# Patient Record
Sex: Female | Born: 1980 | Race: Black or African American | Hispanic: No | Marital: Single | Smoking: Never smoker
Health system: Southern US, Community
[De-identification: ages and names within clinical notes are randomized; demographics above are authoritative.]

## PROBLEM LIST (undated history)

## (undated) HISTORY — PX: ABLATION: SHX5711

---

## 2004-02-23 ENCOUNTER — Ambulatory Visit: Payer: Self-pay | Admitting: Cardiology

## 2004-02-23 ENCOUNTER — Ambulatory Visit (HOSPITAL_COMMUNITY): Admission: RE | Admit: 2004-02-23 | Discharge: 2004-02-23 | Payer: Self-pay | Admitting: Internal Medicine

## 2004-04-01 ENCOUNTER — Encounter: Admission: RE | Admit: 2004-04-01 | Discharge: 2004-04-01 | Payer: Self-pay | Admitting: *Deleted

## 2004-04-07 ENCOUNTER — Ambulatory Visit (HOSPITAL_COMMUNITY): Admission: RE | Admit: 2004-04-07 | Discharge: 2004-04-08 | Payer: Self-pay | Admitting: *Deleted

## 2006-04-22 ENCOUNTER — Emergency Department: Payer: Self-pay | Admitting: Emergency Medicine

## 2007-02-28 ENCOUNTER — Emergency Department (HOSPITAL_COMMUNITY): Admission: EM | Admit: 2007-02-28 | Discharge: 2007-02-28 | Payer: Self-pay | Admitting: Emergency Medicine

## 2007-08-08 ENCOUNTER — Emergency Department (HOSPITAL_COMMUNITY): Admission: EM | Admit: 2007-08-08 | Discharge: 2007-08-08 | Payer: Self-pay | Admitting: Emergency Medicine

## 2007-10-25 ENCOUNTER — Ambulatory Visit (HOSPITAL_COMMUNITY): Payer: Self-pay | Admitting: Oncology

## 2007-10-25 ENCOUNTER — Encounter (HOSPITAL_COMMUNITY): Admission: RE | Admit: 2007-10-25 | Discharge: 2007-11-24 | Payer: Self-pay | Admitting: Oncology

## 2007-11-07 ENCOUNTER — Encounter: Admission: RE | Admit: 2007-11-07 | Discharge: 2007-11-07 | Payer: Self-pay | Admitting: Oncology

## 2007-12-02 ENCOUNTER — Encounter: Admission: RE | Admit: 2007-12-02 | Discharge: 2007-12-02 | Payer: Self-pay | Admitting: Oncology

## 2008-07-22 ENCOUNTER — Encounter (HOSPITAL_COMMUNITY): Admission: RE | Admit: 2008-07-22 | Discharge: 2008-08-21 | Payer: Self-pay | Admitting: Oncology

## 2008-07-22 ENCOUNTER — Ambulatory Visit (HOSPITAL_COMMUNITY): Payer: Self-pay | Admitting: Oncology

## 2008-12-11 ENCOUNTER — Emergency Department (HOSPITAL_COMMUNITY): Admission: EM | Admit: 2008-12-11 | Discharge: 2008-12-11 | Payer: Self-pay | Admitting: Emergency Medicine

## 2009-03-01 ENCOUNTER — Encounter: Admission: RE | Admit: 2009-03-01 | Discharge: 2009-03-12 | Payer: Self-pay | Admitting: Orthopedic Surgery

## 2009-03-16 ENCOUNTER — Encounter: Admission: RE | Admit: 2009-03-16 | Discharge: 2009-03-29 | Payer: Self-pay | Admitting: Orthopedic Surgery

## 2009-07-26 ENCOUNTER — Encounter (HOSPITAL_COMMUNITY): Admission: RE | Admit: 2009-07-26 | Discharge: 2009-08-25 | Payer: Self-pay | Admitting: Oncology

## 2009-07-26 ENCOUNTER — Ambulatory Visit (HOSPITAL_COMMUNITY): Payer: Self-pay | Admitting: Oncology

## 2010-01-16 ENCOUNTER — Emergency Department (HOSPITAL_COMMUNITY): Admission: EM | Admit: 2010-01-16 | Discharge: 2010-01-16 | Payer: Self-pay | Admitting: Emergency Medicine

## 2010-04-17 ENCOUNTER — Emergency Department (HOSPITAL_COMMUNITY)
Admission: EM | Admit: 2010-04-17 | Discharge: 2010-04-17 | Disposition: A | Discharge status: Home / Self Care | Payer: Medicaid Other | Attending: Emergency Medicine | Admitting: Emergency Medicine

## 2010-04-17 DIAGNOSIS — R112 Nausea with vomiting, unspecified: Secondary | ICD-10-CM | POA: Insufficient documentation

## 2010-04-17 DIAGNOSIS — M549 Dorsalgia, unspecified: Secondary | ICD-10-CM | POA: Insufficient documentation

## 2010-04-17 DIAGNOSIS — G8929 Other chronic pain: Secondary | ICD-10-CM | POA: Insufficient documentation

## 2010-04-17 LAB — DIFFERENTIAL
Eosinophils Absolute: 0.1 10*3/uL (ref 0.0–0.7)
Eosinophils Relative: 2 % (ref 0–5)
Lymphocytes Relative: 35 % (ref 12–46)
Lymphs Abs: 2.2 10*3/uL (ref 0.7–4.0)
Monocytes Relative: 9 % (ref 3–12)

## 2010-04-17 LAB — COMPREHENSIVE METABOLIC PANEL
Alkaline Phosphatase: 55 U/L (ref 39–117)
BUN: 9 mg/dL (ref 6–23)
CO2: 26 mEq/L (ref 19–32)
Calcium: 8.8 mg/dL (ref 8.4–10.5)
GFR calc non Af Amer: >60 mL/min (ref >60)
Glucose, Bld: 87 mg/dL (ref 70–99)
Potassium: 4.2 mEq/L (ref 3.5–5.1)
Total Protein: 7 g/dL (ref 6.0–8.3)

## 2010-04-17 LAB — URINALYSIS, ROUTINE W REFLEX MICROSCOPIC
Bilirubin Urine: NEGATIVE
Ketones, ur: NEGATIVE mg/dL
Leukocytes, UA: NEGATIVE
Nitrite: NEGATIVE
Urobilinogen, UA: 0.2 mg/dL (ref 0.0–1.0)
pH: 6.5 (ref 5.0–8.0)

## 2010-04-17 LAB — CBC
HCT: 39.7 % (ref 36.0–46.0)
MCH: 31 pg (ref 26.0–34.0)
MCV: 88 fL (ref 78.0–100.0)
Platelets: 348 10*3/uL (ref 150–400)
RDW: 12.2 % (ref 11.5–15.5)

## 2010-04-17 LAB — LIPASE, BLOOD: Lipase: 16 U/L (ref 11–59)

## 2010-05-30 LAB — DIFFERENTIAL
Basophils Absolute: 0 10*3/uL (ref 0.0–0.1)
Basophils Relative: 1 % (ref 0–1)
Eosinophils Absolute: 0.1 10*3/uL (ref 0.0–0.7)
Eosinophils Relative: 1 % (ref 0–5)
Neutrophils Relative %: 54 % (ref 43–77)

## 2010-05-30 LAB — CBC
HCT: 41 % (ref 36.0–46.0)
MCHC: 35.5 g/dL (ref 30.0–36.0)
MCV: 86.9 fL (ref 78.0–100.0)
Platelets: 407 10*3/uL — ABNORMAL HIGH (ref 150–400)
RDW: 12.6 % (ref 11.5–15.5)
WBC: 6.9 10*3/uL (ref 4.0–10.5)

## 2010-05-30 LAB — FERRITIN: Ferritin: 13 ng/mL (ref 10–291)

## 2010-06-16 LAB — CBC
HCT: 42.2 % (ref 36.0–46.0)
Hemoglobin: 14.6 g/dL (ref 12.0–15.0)
MCV: 89.1 fL (ref 78.0–100.0)
Platelets: 386 10*3/uL (ref 150–400)
RDW: 12 % (ref 11.5–15.5)

## 2010-06-16 LAB — DIFFERENTIAL
Basophils Absolute: 0 10*3/uL (ref 0.0–0.1)
Eosinophils Absolute: 0.1 10*3/uL (ref 0.0–0.7)
Eosinophils Relative: 1 % (ref 0–5)
Lymphocytes Relative: 38 % (ref 12–46)
Lymphs Abs: 2.6 10*3/uL (ref 0.7–4.0)
Monocytes Absolute: 0.3 10*3/uL (ref 0.1–1.0)

## 2010-06-16 LAB — HEMOCCULT GUIAC POC 1CARD (OFFICE): Fecal Occult Bld: NEGATIVE

## 2010-06-16 LAB — POCT I-STAT, CHEM 8
BUN: 8 mg/dL (ref 6–23)
Calcium, Ion: 1.14 mmol/L (ref 1.12–1.32)
Chloride: 103 mEq/L (ref 96–112)
Glucose, Bld: 86 mg/dL (ref 70–99)
TCO2: 25 mmol/L (ref 0–100)

## 2010-06-16 LAB — URINALYSIS, ROUTINE W REFLEX MICROSCOPIC
Bilirubin Urine: NEGATIVE
Ketones, ur: NEGATIVE mg/dL
Nitrite: NEGATIVE
Protein, ur: NEGATIVE mg/dL
pH: 7 (ref 5.0–8.0)

## 2010-06-21 LAB — DIFFERENTIAL
Basophils Absolute: 0 10*3/uL (ref 0.0–0.1)
Lymphocytes Relative: 37 % (ref 12–46)
Lymphs Abs: 2.1 10*3/uL (ref 0.7–4.0)
Monocytes Absolute: 0.3 10*3/uL (ref 0.1–1.0)
Monocytes Relative: 5 % (ref 3–12)
Neutro Abs: 3.2 10*3/uL (ref 1.7–7.7)

## 2010-06-21 LAB — CBC
HCT: 39.5 % (ref 36.0–46.0)
MCV: 88.1 fL (ref 78.0–100.0)
Platelets: 389 10*3/uL (ref 150–400)
RDW: 12.5 % (ref 11.5–15.5)

## 2010-06-21 LAB — COMPREHENSIVE METABOLIC PANEL
Albumin: 3.6 g/dL (ref 3.5–5.2)
BUN: 11 mg/dL (ref 6–23)
Calcium: 9.2 mg/dL (ref 8.4–10.5)
Creatinine, Ser: 0.66 mg/dL (ref 0.4–1.2)
GFR calc Af Amer: >60 mL/min (ref >60)
Total Protein: 7.5 g/dL (ref 6.0–8.3)

## 2010-06-21 LAB — IRON AND TIBC
Iron: 75 ug/dL (ref 42–135)
TIBC: 310 ug/dL (ref 250–470)
UIBC: 235 ug/dL

## 2010-06-21 LAB — FERRITIN: Ferritin: 26 ng/mL (ref 10–291)

## 2010-07-29 NOTE — Discharge Summary (Signed)
[name], [name]               ACCOUNT NO.:  [id number]   MEDICAL RECORD NO.:  [id number]          PATIENT TYPE:  OIB   LOCATION:  6532                         FACILITY:  [facility]   PHYSICIAN:  Mark E. Severiano Gilbert, M.D.    DATE OF BIRTH:  1980-08-24   DATE OF ADMISSION:  04/07/2004  DATE OF DISCHARGE:  04/08/2004                                 DISCHARGE SUMMARY   HISTORY:  Ms. Bronaugh came in the hospital as an outpatient for  electrophysiology study and possible radiofrequency ablation by Dr. Severiano Gilbert.   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  This was  successful.  The morning of April 08, 2004 she was seen by Dr. Severiano Gilbert.  Her  blood pressure was 115/65, heart rate 75, respirations 12 to 16.  He  recommended she go home on aspirin 325 mg daily X30 days.  She will follow  up with Dr. Severiano Gilbert on May 10, 2004 at 11:30.  She should do no strenuous  activity, no pushing, pulling, lifting x4 days.   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      BB/MEDQ  D:  04/08/2004  T:  04/08/2004  Job:  [id number]   cc:   Tesfaye D. Felecia Shelling, MD  [address]  [place]  [place] [address]  Fax: [phone]

## 2010-07-29 NOTE — Cardiovascular Report (Signed)
[name], [name]               ACCOUNT NO.:  [id number]   MEDICAL RECORD NO.:  [id number]          PATIENT TYPE:  OIB   LOCATION:  6532                         FACILITY:  [facility]   PHYSICIAN:  Mark E. Severiano Gilbert, M.D.    DATE OF BIRTH:  08-30-1980   DATE OF PROCEDURE:  04/07/2004  DATE OF DISCHARGE:                              CARDIAC CATHETERIZATION   PROCEDURES PERFORMED:  1.  Comprehensive electrophysiology study with arrhythmia induction to      include left atrial pacing from coronary sinus and left atrial coronary      sinus and right atrial mapping.  2.  Radiofrequency ablation of left lateral accessory pathway.  3.  Transseptal puncture for access to left side for ablation.  4.  Isoproterenol infusion with arrhythmia induction.   PREPROCEDURE DIAGNOSIS:  Paroxysmal supraventricular tachycardia.   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   OPERATOR:  Mark E. Severiano Gilbert, M.D.   COMPLICATIONS:  None other than mild nausea.   ESTIMATED BLOOD LOSS:  Less than 30 mL.   PROCEDURE IN DETAIL:  The patient was brought to the EP laboratory in a  fasted state.  The patient was prepped and draped over bilateral groins in  usual manner.  Anesthesia with 1% local lidocaine.  Conscious sedation with  intermittent injections of midazolam and fentanyl.  Two 7 French sheaths  were placed in the left femoral vein.  An 8 French SL2 sheath was placed in  the right femoral vein.  A 7 French short sheath was placed in the right  femoral vein.  A 7 French short sheath was placed in the right femoral  artery.  Via the catheters, a quadripolar catheter was placed in the high  right atrium.  A hexapolar catheter was placed in the His' bundle area.  A  quadripolar catheter was placed in the right ventricular apex and a  decapolar catheter was placed in the coronary sinus  with difficulty.  Coronary sinus positioning was tenuous and we repeatedly lost position.  A  diagnostic electrophysiology study was performed which demonstrated the  following findings:  Baseline rhythm was sinus rhythm with a cycle of 678  msec.  Normal PR, QRS and QT intervals.  Measured AH was 76 msec.  HV was  39.  There was no evidence of preexcitation.  The retrograde VA Wenckebach  was 230 msec with eccentric nondecremental atrial activation.  The  retrograde effective refractory period of the [place] conduction was 250 msec off  of a drive train of 161 msec, again with nondecremental, nonmidline  conduction.  Antegrade affective refractory period testing of the AV node  was precluded secondary to abundant arrhythmia induction.  Arrhythmia was  induced easily with spontaneously introduced PACs or catheter-induced PACs  or electrically induced PACs.  There did not appear to be dual AV node  physiology.  There was no ventricular excitation.  During decremental AV  node conduction, there was retrograde activation of an accessory pathway  localized on the left side around  the left lateral mitral annulus.  This  resulted in a narrow complex tachycardia, cycle length approximately 290  msec.  The VA time was greater than 70 msec.  Preexcitation index suggested  a left-sided accessory pathway.  Secondary to difficulty maintaining the  coronary sinus position, a retrograde aortic approach was used to place an  EPT standard curve 4 mm tip 7 French ablation catheter into the left  ventricle and left atrium.  It was very difficult to position this catheter  on the annulus secondary to constant interaction with ventricular  trabeculations.  While this confirmed eccentric atrial activation during  tachycardia, as well as ventricular pacing, I was unable to position the  catheter in a position that would allow ablation despite multiple attempts.  Next, the coronary sinus was mapped via a right-sided  sheath and noted to  have very early [place] conduction along the left posterolateral margin.  Several  radiofrequency burns delivered in this area were unable to interrupt  retrograde accessory pathway conduction.  Finally, under continuous  fluoroscopic and hemodynamic guidance, a Diag SL2 catheter using a Brock  needle was used to puncture the fossa ovalis.  After confirming position in  the atrial both fluoroscopically, as well as hemodynamics, this sheath was  used to advance the EPT standard curve ablation catheter onto the left  lateral annulus of the mitral valve.  The mitral valve annulus was mapped  carefully and several burns were delivered without effect.  Finally, a very  early continuous signal was developed at approximately 5 o'clock in the LAO  projection on the left posterolateral annulus of the mitral valve.  There  was rapid loss of VA conduction after about 10 seconds of RF delivery, which  was continued for 45 seconds.  There were excellent heating characteristics.  Two short 20-second burns were done on either side of the initially  successful burn for insurance.  After waiting 20 minutes, post EP study was  performed.  The AV node ERP on isoproterenol was 550 and less than 230  secondary to atrial ERP.  Off of isoproterenol, the atrial ERP was noted to  be 220 msec off of a drive train of 478 msec.  The VA block cycle length was  noted to be 550 msec, which is dramatically changed from prior to ablation.  After awaiting 20 minutes, there was still persistent of VA conduction block  during right ventricular apical pacing consistent with successful ablation  of the left posterolateral accessory pathway.  The patient tolerated the  procedure well, except for some mild nausea with occasional vomitus.  She  was treated with Zofran and 12.5 mg of Phenergan with some abatement.  This  appears related to narcotic use for sedation.  IMPRESSION:  Paroxysmal supraventricular  tachycardia secondary to an  orthodromic atrioventricular re-entrant tachycardia using a left-sided  accessory pathway.  Successful left-sided accessory pathway ablation.      MEP/MEDQ  D:  04/07/2004  T:  04/07/2004  Job:  [id number]

## 2010-07-29 NOTE — Procedures (Signed)
[name], [name]               ACCOUNT NO.:  [id number]   MEDICAL RECORD NO.:  [id number]          PATIENT TYPE:  OUT   LOCATION:  RAD                           FACILITY:  [facility]   PHYSICIAN:  Belgrade Bing, M.D.  DATE OF BIRTH:  1980-03-24   DATE OF PROCEDURE:  02/23/2004  DATE OF DISCHARGE:                                  ECHOCARDIOGRAM   CLINICAL DATA:  A 30 year old woman with palpitations and dyspnea.   M-MODE:  1.  Aorta 2.3.  2.  Left atrium 3.3.  3.  Septum 1.0.  4.  Posterior wall 0.9.  5.  LV diastole 4.1.  6.  LV systole 3.0.   1.  Technically adequate echocardiographic study.  2.  Normal left atrium, right atrium, and right ventricle.  3.  Normal aorta, mitral, tricuspid, and pulmonic valves.  4.  Normal internal dimension, wall thickness, regional and global function      of the left ventricle.  5.  Normal IVC.  6.  Normal Doppler examination; physiologic tricuspid regurgitation;      estimate of RV systolic pressure is not precise but appears      approximately normal.     Robe   RR/MEDQ  D:  02/24/2004  T:  02/24/2004  Job:  [id number]

## 2010-12-09 LAB — COMPREHENSIVE METABOLIC PANEL
ALT: 14
Albumin: 3.8
Alkaline Phosphatase: 70
GFR calc Af Amer: >60
Potassium: 4.1
Sodium: 136
Total Protein: 6.9

## 2010-12-09 LAB — DIFFERENTIAL
Basophils Relative: 1
Monocytes Absolute: 0.3
Monocytes Relative: 5
Neutro Abs: 3.9

## 2010-12-09 LAB — CBC
Platelets: 487 — ABNORMAL HIGH
RDW: 13.2

## 2010-12-09 LAB — IRON AND TIBC: Saturation Ratios: 28

## 2011-03-14 ENCOUNTER — Encounter: Payer: Self-pay | Admitting: *Deleted

## 2011-03-14 ENCOUNTER — Emergency Department (HOSPITAL_COMMUNITY)
Admission: EM | Admit: 2011-03-14 | Discharge: 2011-03-14 | Disposition: A | Discharge status: Home / Self Care | Payer: Medicaid Other | Source: Home / Self Care

## 2011-03-14 DIAGNOSIS — J039 Acute tonsillitis, unspecified: Secondary | ICD-10-CM

## 2011-03-14 DIAGNOSIS — J069 Acute upper respiratory infection, unspecified: Secondary | ICD-10-CM

## 2011-03-14 MED ORDER — LORATADINE 10 MG PO TABS: 10.0000 mg | ORAL_TABLET | Freq: Every day | ORAL | Status: DC

## 2011-03-14 MED ORDER — AMOXICILLIN 875 MG PO TABS: 875.0000 mg | ORAL_TABLET | Freq: Two times a day (BID) | ORAL | Status: AC

## 2011-03-14 NOTE — ED Notes (Signed)
Pt  Reports        Symptoms  Of  sorethroat        Aching  All over         Congested  And  Cough    Symptoms  X   1   Week           Pt  Is  Sitting  On  Exam    Table  In no  Distress          pts  Appearance  Does  Not  Reflect  Stated  Pain  Scale

## 2011-03-14 NOTE — ED Provider Notes (Signed)
History     CSN: [id number]  Arrival date & time 03/14/11  1106   None     Chief Complaint  Patient presents with  . Sore Throat    (Consider location/radiation/quality/duration/timing/severity/associated sxs/prior treatment) HPI Comments: Pt presents with c/o sore throat - onset 2-3 days ago. Pain worsens with swallowing. She has had nasal congestion, sneezing, cough and chills for a couple of weeks. Nasal mucus is clear and cough is nonproductive. She denies taking any cough or cold medications for symptoms.   The history is provided by the patient.    History reviewed. No pertinent past medical history.  History reviewed. No pertinent past surgical history.  History reviewed. No pertinent family history.  History  Substance Use Topics  . Smoking status: Never Smoker   . Smokeless tobacco: Not on file  . Alcohol Use: No    OB History    Grav Para Term Preterm Abortions TAB SAB Ect Mult Living                  Review of Systems  Constitutional: Positive for chills. Negative for fever and fatigue.  HENT: Positive for congestion, sore throat, rhinorrhea and sneezing. Negative for ear pain, postnasal drip and sinus pressure.   Respiratory: Positive for cough. Negative for shortness of breath and wheezing.   Cardiovascular: Negative for chest pain and palpitations.    Allergies  Review of patient's allergies indicates no known allergies.  Home Medications   Current Outpatient Rx  Name Route Sig Dispense Refill  . IBUPROFEN 800 MG PO TABS Oral Take 800 mg by mouth every 8 (eight) hours as needed.      . AMOXICILLIN 875 MG PO TABS Oral Take 1 tablet (875 mg total) by mouth 2 (two) times daily. 20 tablet 0  . LORATADINE 10 MG PO TABS Oral Take 1 tablet (10 mg total) by mouth daily. 30 tablet 0    BP 129/106  Pulse 92  Temp(Src) 100 F (37.8 C) (Oral)  Resp 18  SpO2 100%  LMP 03/01/2011  Physical Exam  Nursing note and vitals reviewed. Constitutional: She  appears well-developed and well-nourished. No distress.  HENT:  Head: Normocephalic and atraumatic. No trismus in the jaw.  Right Ear: Tympanic membrane, external ear and ear canal normal.  Left Ear: Tympanic membrane, external ear and ear canal normal.  Nose: Nose normal.  Mouth/Throat: Uvula is midline and mucous membranes are normal. No oral lesions. No uvula swelling. Oropharyngeal exudate and posterior oropharyngeal erythema present. No posterior oropharyngeal edema or tonsillar abscesses.  Neck: Neck supple.  Cardiovascular: Normal rate, regular rhythm and normal heart sounds.   Pulmonary/Chest: Effort normal and breath sounds normal. No respiratory distress.  Lymphadenopathy:    She has no cervical adenopathy.  Neurological: She is alert.  Skin: Skin is warm and dry.  Psychiatric: She has a normal mood and affect.    ED Course  Procedures (including critical care time)  Labs Reviewed - No data to display No results found.   1. Tonsillitis   2. Acute URI       MDM  Bilat tonsilar erythema and mild exudate with URI symptoms.         Melody Comas, [place] 03/14/11 1409

## 2011-03-18 NOTE — ED Provider Notes (Signed)
Medical screening examination/treatment/procedure(s) were performed by non-physician practitioner and as supervising physician I was immediately available for consultation/collaboration.  Srishti Strnad M. MD   Kenlei Safi M Jameia Makris, MD 03/18/11 1824 

## 2012-07-21 ENCOUNTER — Emergency Department (HOSPITAL_COMMUNITY)
Admission: EM | Admit: 2012-07-21 | Discharge: 2012-07-21 | Disposition: A | Discharge status: Home / Self Care | Payer: Self-pay | Attending: Emergency Medicine | Admitting: Emergency Medicine

## 2012-07-21 ENCOUNTER — Encounter (HOSPITAL_COMMUNITY): Payer: Self-pay | Admitting: Emergency Medicine

## 2012-07-21 ENCOUNTER — Emergency Department (HOSPITAL_COMMUNITY): Payer: Self-pay

## 2012-07-21 DIAGNOSIS — Y9367 Activity, basketball: Secondary | ICD-10-CM | POA: Insufficient documentation

## 2012-07-21 DIAGNOSIS — S62639A Displaced fracture of distal phalanx of unspecified finger, initial encounter for closed fracture: Secondary | ICD-10-CM | POA: Insufficient documentation

## 2012-07-21 DIAGNOSIS — Y92838 Other recreation area as the place of occurrence of the external cause: Secondary | ICD-10-CM | POA: Insufficient documentation

## 2012-07-21 DIAGNOSIS — W219XXA Striking against or struck by unspecified sports equipment, initial encounter: Secondary | ICD-10-CM | POA: Insufficient documentation

## 2012-07-21 DIAGNOSIS — Y9239 Other specified sports and athletic area as the place of occurrence of the external cause: Secondary | ICD-10-CM | POA: Insufficient documentation

## 2012-07-21 DIAGNOSIS — Z880 Allergy status to penicillin: Secondary | ICD-10-CM | POA: Insufficient documentation

## 2012-07-21 DIAGNOSIS — Z87828 Personal history of other (healed) physical injury and trauma: Secondary | ICD-10-CM | POA: Insufficient documentation

## 2012-07-21 MED ORDER — NAPROXEN 500 MG PO TABS: 500.0000 mg | ORAL_TABLET | Freq: Two times a day (BID) | ORAL | Status: DC

## 2012-07-21 MED ORDER — HYDROCODONE-ACETAMINOPHEN 5-325 MG PO TABS: 1.0000 | ORAL_TABLET | ORAL | Status: DC | PRN

## 2012-07-21 NOTE — Progress Notes (Signed)
Orthopedic Tech Progress Note Patient Details:  [name] June 12, 1980 [id number]  Ortho Devices Type of Ortho Device: Finger splint   Haskell Flirt 07/21/2012, 11:22 PM

## 2012-07-21 NOTE — ED Provider Notes (Signed)
History    This chart was scribed for a non-physician practitioner, Dierdre Forth, PA-C, working with Loren Racer, MD by Frederik Pear, ED Scribe. This patient was seen in room TR05C/TR05C and the patient's care was started at 2144.   CSN: [id number]  Arrival date & time 07/21/12  2127   First MD Initiated Contact with Patient 07/21/12 2144      Chief Complaint  Patient presents with  . Hand Pain    (Consider location/radiation/quality/duration/timing/severity/associated sxs/prior treatment) Patient is a 32 y.o. female presenting with hand pain. The history is provided by the patient and medical records. No language interpreter was used.  Hand Pain This is a new problem. The current episode started more than 1 week ago. The problem occurs constantly. The problem has been gradually worsening. Pertinent negatives include no chest pain, no abdominal pain, no headaches and no shortness of breath. Exacerbated by: movement. Nothing relieves the symptoms. Treatments tried: static finger splint. The treatment provided no relief.    HPI Comments:  [name] is a 32 y.o. female brought in by parents to the Emergency Department complaining of sudden onset, gradually worsening, severe left index finger pain that is aggravated by moving it and alleviated by thing that began 1 month ago when she jammed it straight on while playing basketball. She reports that since the initial injury that although she has been wearing a static finger splint that she has repeatedly injured the finger over the last month. She denies fever, chills, or drainage from the fever. She denies treatment at home. She has no chronic medical conditions that she require daily medications.  History reviewed. No pertinent past medical history.  Past Surgical History  Procedure Laterality Date  . Ablation      History reviewed. No pertinent family history.  History  Substance Use Topics  . Smoking status: Never  Smoker   . Smokeless tobacco: Not on file  . Alcohol Use: No    OB History   Grav Para Term Preterm Abortions TAB SAB Ect Mult Living                  Review of Systems  Constitutional: Negative for fever, diaphoresis, appetite change, fatigue and unexpected weight change.  HENT: Negative for mouth sores and neck stiffness.   Eyes: Negative for visual disturbance.  Respiratory: Negative for cough, chest tightness, shortness of breath and wheezing.   Cardiovascular: Negative for chest pain.  Gastrointestinal: Negative for nausea, vomiting, abdominal pain, diarrhea and constipation.  Endocrine: Negative for polydipsia, polyphagia and polyuria.  Genitourinary: Negative for dysuria, urgency, frequency and hematuria.  Musculoskeletal: Positive for arthralgias (left index finger). Negative for back pain.  Skin: Negative for rash.  Allergic/Immunologic: Negative for immunocompromised state.  Neurological: Negative for syncope, light-headedness and headaches.  Hematological: Does not bruise/bleed easily.  Psychiatric/Behavioral: Negative for sleep disturbance. The patient is not nervous/anxious.     Allergies  Penicillins  Home Medications   Current Outpatient Rx  Name  Route  Sig  Dispense  Refill  . HYDROcodone-acetaminophen (NORCO/VICODIN) 5-325 MG per tablet   Oral   Take 1 tablet by mouth every 4 (four) hours as needed for pain.   6 tablet   0   . naproxen (NAPROSYN) 500 MG tablet   Oral   Take 1 tablet (500 mg total) by mouth 2 (two) times daily with a meal.   30 tablet   0     BP 137/87  Pulse 73  Temp(Src)  98.6 F (37 C) (Oral)  Resp 16  SpO2 98%  LMP 07/15/2012  Physical Exam  Nursing note and vitals reviewed. Constitutional: She is oriented to person, place, and time. She appears well-developed and well-nourished. No distress.  HENT:  Head: Normocephalic and atraumatic.  Mouth/Throat: Oropharynx is clear and moist. No oropharyngeal exudate.  Eyes:  Conjunctivae are normal. No scleral icterus.  Neck: Normal range of motion. Neck supple.  Cardiovascular: Normal rate, regular rhythm, normal heart sounds and intact distal pulses.   No murmur heard. Pulmonary/Chest: Effort normal and breath sounds normal. No respiratory distress. She has no wheezes.  Abdominal: Soft. Bowel sounds are normal. She exhibits no mass. There is no tenderness. There is no rebound and no guarding.  Musculoskeletal: Normal range of motion. She exhibits tenderness. She exhibits no edema.  Left index finger flexor tendon is intact. Extensor tendon is intact. Swelling noted to the distal portion of the left pointer finger with tenderness to palpation. Intact flexion with absent extension of the left pointer finger.    Neurological: She is alert and oriented to person, place, and time. A sensory deficit is present. She exhibits normal muscle tone. Coordination normal.  Speech is clear and goal oriented Moves extremities without ataxia Decrease sensation from DIP to finger tip.  Normal sensation from DIP to palm.  Skin: Skin is warm and dry. No rash noted. She is not diaphoretic.  Discoloration and pigmentation loss of the distal pointer and middle finger  Psychiatric: She has a normal mood and affect.    ED Course  Procedures (including critical care time)  DIAGNOSTIC STUDIES: Oxygen Saturation is 98% on room air, normal by my interpretation.    COORDINATION OF CARE:  22:70- Discussed planned course of treatment with the patient's mother, including following up with a hand surgeon, antiinflammatories, and pain medication, who is agreeable at this time.  Labs Reviewed - No data to display Dg Finger Index Left  07/21/2012  *RADIOLOGY REPORT*  Clinical Data: Hand pain  LEFT INDEX FINGER 2+V  Comparison: None.  Findings: Fracture of the dorsal base of the distal phalanx second digit with associated displacement and overlying soft tissue swelling.  IMPRESSION:  Avulsion type fracture dorsal base of the distal phalanx second digit.   Original Report Authenticated By: Jearld Lesch, M.D.      1. Avulsion fracture of distal phalanx of finger, closed, initial encounter       MDM  [name] presents with > 1 mo of finger pain.  Patient X-Ray with Avulsion type fracture dorsal base of the distal phalanx second digit. I personally reviewed the imaging tests through PACS system.  I reviewed available ER/hospitalization records through the EMR. Likely tendon rupture 2/2 hx, PE and x-ray.  Since injury is old, will have pt f/u with hand surgery on an outpatient basis.finger splinted while in ED, conservative therapy recommended and discussed. Patient will be dc home & is agreeable with above plan.   I personally performed the services described in this documentation, which was scribed in my presence. The recorded information has been reviewed and is accurate.    Dahlia Client Giavonni Fonder, PA-C 07/21/12 2334

## 2012-07-21 NOTE — ED Notes (Signed)
Patient states she rejammed her pointer finger on left hand, increased pain and swelling.

## 2012-07-21 NOTE — ED Notes (Signed)
Patient reports numbness in tip of  left index finger.  PMS intact, no deformity noted.

## 2012-07-23 NOTE — ED Provider Notes (Signed)
Medical screening examination/treatment/procedure(s) were performed by non-physician practitioner and as supervising physician I was immediately available for consultation/collaboration.   Loren Racer, MD 07/23/12 1544

## 2012-12-27 ENCOUNTER — Encounter (HOSPITAL_COMMUNITY): Payer: Self-pay | Admitting: Emergency Medicine

## 2012-12-27 ENCOUNTER — Emergency Department (HOSPITAL_COMMUNITY)
Admission: EM | Admit: 2012-12-27 | Discharge: 2012-12-27 | Disposition: A | Discharge status: Home / Self Care | Payer: Medicaid Other | Attending: Emergency Medicine | Admitting: Emergency Medicine

## 2012-12-27 DIAGNOSIS — Z79899 Other long term (current) drug therapy: Secondary | ICD-10-CM | POA: Insufficient documentation

## 2012-12-27 DIAGNOSIS — L509 Urticaria, unspecified: Secondary | ICD-10-CM | POA: Insufficient documentation

## 2012-12-27 DIAGNOSIS — Z880 Allergy status to penicillin: Secondary | ICD-10-CM | POA: Insufficient documentation

## 2012-12-27 DIAGNOSIS — Z791 Long term (current) use of non-steroidal anti-inflammatories (NSAID): Secondary | ICD-10-CM | POA: Insufficient documentation

## 2012-12-27 MED ORDER — PREDNISONE 20 MG PO TABS
20.0000 mg | ORAL_TABLET | Freq: Once | ORAL | Status: AC
  Administered 2012-12-27: 20 mg via ORAL
  Filled 2012-12-27: qty 1

## 2012-12-27 MED ORDER — PREDNISONE 10 MG PO TABS: 20.0000 mg | ORAL_TABLET | Freq: Two times a day (BID) | ORAL | Status: DC

## 2012-12-27 NOTE — ED Provider Notes (Signed)
CSN: [id number]     Arrival date & time 12/27/12  0047 History   First MD Initiated Contact with Patient 12/27/12 0118     Chief Complaint  Patient presents with  . Allergic Reaction   (Consider location/radiation/quality/duration/timing/severity/associated sxs/prior Treatment) HPI Comments: Patient presents with two-day history of rash to the front of her abdomen and anterior thighs. She denies any new contacts or exposures. She denies any pain. She states the rash is itchy and raised.  Patient is a 32 y.o. female presenting with allergic reaction. The history is provided by the patient.  Allergic Reaction Presenting symptoms: itching and rash   Severity:  Moderate Prior allergic episodes:  No prior episodes Context comment:  Unknown Relieved by:  Nothing Worsened by:  Nothing tried Ineffective treatments:  Antihistamines   History reviewed. No pertinent past medical history. Past Surgical History  Procedure Laterality Date  . Ablation     History reviewed. No pertinent family history. History  Substance Use Topics  . Smoking status: Never Smoker   . Smokeless tobacco: Not on file  . Alcohol Use: No   OB History   Grav Para Term Preterm Abortions TAB SAB Ect Mult Living                 Review of Systems  Skin: Positive for itching and rash.  All other systems reviewed and are negative.    Allergies  Penicillins  Home Medications   Current Outpatient Rx  Name  Route  Sig  Dispense  Refill  . ibuprofen (ADVIL,MOTRIN) 800 MG tablet   Oral   Take 800 mg by mouth every 8 (eight) hours as needed for pain.         Marland Kitchen omeprazole (PRILOSEC) 20 MG capsule   Oral   Take 20 mg by mouth daily.         Marland Kitchen HYDROcodone-acetaminophen (NORCO/VICODIN) 5-325 MG per tablet   Oral   Take 1 tablet by mouth every 4 (four) hours as needed for pain.   6 tablet   0   . naproxen (NAPROSYN) 500 MG tablet   Oral   Take 1 tablet (500 mg total) by mouth 2 (two) times daily with  a meal.   30 tablet   0    BP 163/100  Pulse 82  Temp(Src) 98.7 F (37.1 C) (Oral)  Resp 16  Ht 5' (1.524 m)  Wt 171 lb (77.565 kg)  BMI 33.4 kg/m2  SpO2 99% Physical Exam  Nursing note and vitals reviewed. Constitutional: She is oriented to person, place, and time. She appears well-developed and well-nourished. No distress.  HENT:  Head: Normocephalic and atraumatic.  Mouth/Throat: Oropharynx is clear and moist.  Neck: Normal range of motion. Neck supple.  Cardiovascular: Normal rate and regular rhythm.   No murmur heard. Pulmonary/Chest: Effort normal and breath sounds normal. No respiratory distress.  Neurological: She is alert and oriented to person, place, and time.  Skin: She is not diaphoretic.  There is a raised erythematous rash to the anterior thighs and anterior abdomen.    ED Course  Procedures (including critical care time) Labs Review Labs Reviewed - No data to display Imaging Review No results found.  EKG Interpretation   None       MDM  No diagnosis found. This appears to be an urticaria. We'll treat with steroids and antihistamines. Return as needed. There is no evidence for anaphylaxis as blood pressure is adequate and there is no airway involvement.  Geoffery Lyons, MD 12/27/12 [phone]

## 2012-12-27 NOTE — ED Notes (Signed)
Patient with rash/welch all over her abdomen and thighs.  States that she noticed a few days ago and it went away and came back today.  Patient took benadryl around 7pm today.

## 2019-04-05 ENCOUNTER — Other Ambulatory Visit: Payer: Self-pay

## 2019-04-05 ENCOUNTER — Ambulatory Visit
Admission: EM | Admit: 2019-04-05 | Discharge: 2019-04-05 | Disposition: A | Discharge status: Home / Self Care | Payer: No Typology Code available for payment source | Attending: Emergency Medicine | Admitting: Emergency Medicine

## 2019-04-05 DIAGNOSIS — Z20822 Contact with and (suspected) exposure to covid-19: Secondary | ICD-10-CM

## 2019-04-05 DIAGNOSIS — R05 Cough: Secondary | ICD-10-CM | POA: Diagnosis present

## 2019-04-05 DIAGNOSIS — J029 Acute pharyngitis, unspecified: Secondary | ICD-10-CM | POA: Diagnosis present

## 2019-04-05 DIAGNOSIS — R059 Cough, unspecified: Secondary | ICD-10-CM

## 2019-04-05 LAB — POCT RAPID STREP A (OFFICE): Rapid Strep A Screen: NEGATIVE

## 2019-04-05 NOTE — ED Triage Notes (Signed)
Pt developed sore throat and swollen tonsils yesterday, denies fever and h/o strep

## 2019-04-05 NOTE — ED Provider Notes (Signed)
[facility] CARE CENTER   [id number] 04/05/19 Arrival Time: 1052  NW:GNFA THROAT  SUBJECTIVE: History from: patient.  [name] is a 39 y.o. female who presents with abrupt onset of slight dry cough, sore throat and swollen tonsils x 1 day.  Denies sick exposure to COVID, flu or strep.  Denies recent travel.  Has NOT tried OTC medication.  Symptoms are made worse with swallowing, but tolerating liquids and own secretions without difficulty.  Reports previous symptoms in the past that improved without intervention.  Denies fever, chills, fatigue, ear pain, sinus pain, rhinorrhea, nasal congestion, cough, SOB, wheezing, chest pain, nausea, rash, changes in bowel or bladder habits.    ROS: As per HPI.  All other pertinent ROS negative.     History reviewed. No pertinent past medical history. Past Surgical History:  Procedure Laterality Date  . ABLATION     Allergies  Allergen Reactions  . Penicillins     Childhood reaction    No current facility-administered medications on file prior to encounter.   Current Outpatient Medications on File Prior to Encounter  Medication Sig Dispense Refill  . ibuprofen (ADVIL,MOTRIN) 800 MG tablet Take 800 mg by mouth every 8 (eight) hours as needed for pain.    . [DISCONTINUED] omeprazole (PRILOSEC) 20 MG capsule Take 20 mg by mouth daily.     Social History   Socioeconomic History  . Marital status: Single    Spouse name: Not on file  . Number of children: Not on file  . Years of education: Not on file  . Highest education level: Not on file  Occupational History  . Not on file  Tobacco Use  . Smoking status: Never Smoker  . Smokeless tobacco: Never Used  Substance and Sexual Activity  . Alcohol use: No  . Drug use: No  . Sexual activity: Not on file  Other Topics Concern  . Not on file  Social History Narrative  . Not on file   Social Determinants of Health   Financial Resource Strain:   . Difficulty of Paying Living  Expenses: Not on file  Food Insecurity:   . Worried About Programme researcher, broadcasting/film/video in the Last Year: Not on file  . Ran Out of Food in the Last Year: Not on file  Transportation Needs:   . Lack of Transportation (Medical): Not on file  . Lack of Transportation (Non-Medical): Not on file  Physical Activity:   . Days of Exercise per Week: Not on file  . Minutes of Exercise per Session: Not on file  Stress:   . Feeling of Stress : Not on file  Social Connections:   . Frequency of Communication with Friends and Family: Not on file  . Frequency of Social Gatherings with Friends and Family: Not on file  . Attends Religious Services: Not on file  . Active Member of Clubs or Organizations: Not on file  . Attends Banker Meetings: Not on file  . Marital Status: Not on file  Intimate Partner Violence:   . Fear of Current or Ex-Partner: Not on file  . Emotionally Abused: Not on file  . Physically Abused: Not on file  . Sexually Abused: Not on file   Family History  Problem Relation Age of Onset  . Healthy Mother   . Healthy Father     OBJECTIVE:  Vitals:   04/05/19 1106  BP: 114/75  Pulse: 86  Resp: 18  Temp: 99 F (37.2 C)  SpO2: 97%  General appearance: alert; well-appearing, nontoxic, speaking in full sentences and managing own secretions HEENT: NCAT; Ears: EACs clear, TMs pearly gray with visible cone of light, without erythema; Eyes: PERRL, EOMI grossly; Nose: patent without rhinorrhea, turbinates mildly swollen and erythematous; Throat: oropharynx clear, tonsils not enlarged or erythematous without white tonsillar exudates, uvula midline Neck: supple without LAD Lungs: CTA bilaterally without adventitious breath sounds; cough absent Heart: regular rate and rhythm.   Skin: warm and dry Psychological: alert and cooperative; normal mood and affect  LABS: Results for orders placed or performed during the hospital encounter of 04/05/19 (from the past 24 hour(s))   POCT rapid strep A     Status: None   Collection Time: 04/05/19 11:21 AM  Result Value Ref Range   Rapid Strep A Screen Negative Negative     ASSESSMENT & PLAN:  1. Suspected COVID-19 virus infection   2. Sore throat   3. Cough    Strep test negative, will send out for culture and we will call you with results Declines COVID test Cannot rule out COVID based on symptoms with negative strep test Per CDC: You should remain isolated in your home for 10 days from symptom onset AND greater than 72 hours after symptoms resolution (absence of fever without the use of fever-reducing medication and improvement in respiratory symptoms), whichever is longer Get plenty of rest and push fluids Drink warm or cool liquids, use throat lozenges, or popsicles to help alleviate symptoms You may use ibuprofen and zyrtec/ flonase as needed for symptomatic relief Follow up with PCP if symptoms persists Return or go to ER if patient has any new or worsening symptoms such as fever, chills, nausea, vomiting, worsening sore throat, cough, abdominal pain, chest pain, changes in bowel or bladder habits, etc...  Reviewed expectations re: course of current medical issues. Questions answered. Outlined signs and symptoms indicating need for more acute intervention. Patient verbalized understanding. After Visit Summary given.        Lestine Box, PA-C 04/05/19 1137

## 2019-04-05 NOTE — Discharge Instructions (Addendum)
Strep test negative, will send out for culture and we will call you with results Declines COVID test Cannot rule out COVID based on symptoms with negative strep test Per CDC: You should remain isolated in your home for 10 days from symptom onset AND greater than 72 hours after symptoms resolution (absence of fever without the use of fever-reducing medication and improvement in respiratory symptoms), whichever is longer Get plenty of rest and push fluids Drink warm or cool liquids, use throat lozenges, or popsicles to help alleviate symptoms You may use ibuprofen and zyrtec/ flonase as needed for symptomatic relief Follow up with PCP if symptoms persists Return or go to ER if patient has any new or worsening symptoms such as fever, chills, nausea, vomiting, worsening sore throat, cough, abdominal pain, chest pain, changes in bowel or bladder habits, etc..Marland Kitchen

## 2019-04-07 LAB — CULTURE, GROUP A STREP (THRC)

## 2020-11-02 ENCOUNTER — Encounter (HOSPITAL_COMMUNITY): Payer: Self-pay

## 2020-11-02 ENCOUNTER — Emergency Department (HOSPITAL_COMMUNITY): Payer: No Typology Code available for payment source

## 2020-11-02 ENCOUNTER — Other Ambulatory Visit: Payer: Self-pay

## 2020-11-02 ENCOUNTER — Emergency Department (HOSPITAL_COMMUNITY)
Admission: EM | Admit: 2020-11-02 | Discharge: 2020-11-02 | Disposition: A | Discharge status: Home / Self Care | Payer: No Typology Code available for payment source | Attending: Emergency Medicine | Admitting: Emergency Medicine

## 2020-11-02 DIAGNOSIS — H6121 Impacted cerumen, right ear: Secondary | ICD-10-CM | POA: Insufficient documentation

## 2020-11-02 DIAGNOSIS — H81391 Other peripheral vertigo, right ear: Secondary | ICD-10-CM | POA: Insufficient documentation

## 2020-11-02 DIAGNOSIS — N9489 Other specified conditions associated with female genital organs and menstrual cycle: Secondary | ICD-10-CM | POA: Insufficient documentation

## 2020-11-02 DIAGNOSIS — R42 Dizziness and giddiness: Secondary | ICD-10-CM | POA: Diagnosis present

## 2020-11-02 LAB — CBC
HCT: 40.4 % (ref 36.0–46.0)
Hemoglobin: 13.8 g/dL (ref 12.0–15.0)
MCH: 30.3 pg (ref 26.0–34.0)
MCHC: 34.2 g/dL (ref 30.0–36.0)
MCV: 88.6 fL (ref 80.0–100.0)
Platelets: 418 10*3/uL — ABNORMAL HIGH (ref 150–400)
RBC: 4.56 MIL/uL (ref 3.87–5.11)
RDW: 12.4 % (ref 11.5–15.5)
WBC: 6.2 10*3/uL (ref 4.0–10.5)
nRBC: 0 % (ref 0.0–0.2)

## 2020-11-02 LAB — COMPREHENSIVE METABOLIC PANEL
ALT: 14 U/L (ref 0–44)
AST: 15 U/L (ref 15–41)
Albumin: 3.8 g/dL (ref 3.5–5.0)
Alkaline Phosphatase: 61 U/L (ref 38–126)
Anion gap: 7 (ref 5–15)
BUN: 11 mg/dL (ref 6–20)
CO2: 27 mmol/L (ref 22–32)
Calcium: 8.6 mg/dL — ABNORMAL LOW (ref 8.9–10.3)
Chloride: 104 mmol/L (ref 98–111)
Creatinine, Ser: 0.73 mg/dL (ref 0.44–1.00)
GFR, Estimated: >60 mL/min (ref >60)
Glucose, Bld: 102 mg/dL — ABNORMAL HIGH (ref 70–99)
Potassium: 4.3 mmol/L (ref 3.5–5.1)
Sodium: 138 mmol/L (ref 135–145)
Total Bilirubin: 0.8 mg/dL (ref 0.3–1.2)
Total Protein: 7.9 g/dL (ref 6.5–8.1)

## 2020-11-02 LAB — I-STAT BETA HCG BLOOD, ED (MC, WL, AP ONLY): I-stat hCG, quantitative: <5 m[IU]/mL (ref <5)

## 2020-11-02 MED ORDER — MECLIZINE HCL 25 MG PO TABS: 25.0000 mg | ORAL_TABLET | Freq: Three times a day (TID) | ORAL | 0 refills | Status: AC | PRN

## 2020-11-02 MED ORDER — SODIUM CHLORIDE 0.9 % IV BOLUS
1000.0000 mL | Freq: Once | INTRAVENOUS | Status: AC
  Administered 2020-11-02: 1000 mL via INTRAVENOUS

## 2020-11-02 MED ORDER — MECLIZINE HCL 12.5 MG PO TABS
25.0000 mg | ORAL_TABLET | Freq: Once | ORAL | Status: AC
  Administered 2020-11-02: 25 mg via ORAL
  Filled 2020-11-02: qty 2

## 2020-11-02 MED ORDER — ONDANSETRON HCL 4 MG/2ML IJ SOLN
4.0000 mg | Freq: Once | INTRAMUSCULAR | Status: AC
  Administered 2020-11-02: 4 mg via INTRAVENOUS
  Filled 2020-11-02: qty 2

## 2020-11-02 NOTE — ED Provider Notes (Signed)
[facility] EMERGENCY DEPARTMENT Provider Note   CSN: [id number] Arrival date & time: 11/02/20  0701     History Chief Complaint  Patient presents with   Dizziness    [name] is a 40 y.o. female.  40 year old female with history as below presented ER secondary to dizziness, nausea.  Patient reports sudden onset of symptoms around 5:30 AM this morning when she got up to adjust the air conditioner thermostat she felt as though the room was spinning around her.  Worse when she turns her head to the right.  Also associated with difficulty focusing, nausea without emesis.  No medications prior to arrival.  No history of this in the past.  Normal state health prior to onset of symptoms yesterday evening.  No tinnitus, no numbness or tingling.  No weakness.  No hearing changes.  No head trauma  The history is provided by the patient. No language interpreter was used.  Dizziness Associated symptoms: nausea   Associated symptoms: no chest pain, no headaches, no palpitations, no shortness of breath and no vomiting       History reviewed. No pertinent past medical history.  There are no problems to display for this patient.   Past Surgical History:  Procedure Laterality Date   ABLATION       OB History   No obstetric history on file.     Family History  Problem Relation Age of Onset   Healthy Mother    Healthy Father     Social History   Tobacco Use   Smoking status: Never   Smokeless tobacco: Never  Vaping Use   Vaping Use: Never used  Substance Use Topics   Alcohol use: No   Drug use: No    Home Medications Prior to Admission medications   Medication Sig Start Date End Date Taking? Authorizing Provider  ibuprofen (ADVIL,MOTRIN) 800 MG tablet Take 800 mg by mouth every 8 (eight) hours as needed for pain.   Yes [provider]  meclizine (ANTIVERT) 25 MG tablet Take 1 tablet (25 mg total) by mouth 3 (three) times daily as needed for dizziness.  11/02/20  Yes Tanda Rockers A, DO  omeprazole (PRILOSEC) 20 MG capsule Take 20 mg by mouth daily.  04/05/19  [provider]    Allergies    Penicillins  Review of Systems   Review of Systems  Constitutional:  Negative for chills and fever.  HENT:  Negative for facial swelling and trouble swallowing.   Eyes:  Negative for photophobia and pain.  Respiratory:  Negative for cough and shortness of breath.   Cardiovascular:  Negative for chest pain and palpitations.  Gastrointestinal:  Positive for nausea. Negative for abdominal pain and vomiting.  Endocrine: Negative for polydipsia and polyuria.  Genitourinary:  Negative for difficulty urinating and hematuria.  Musculoskeletal:  Negative for gait problem and joint swelling.  Skin:  Negative for pallor and rash.  Neurological:  Positive for dizziness. Negative for syncope and headaches.  Psychiatric/Behavioral:  Negative for agitation and confusion.    Physical Exam Updated Vital Signs BP (!) 154/95   Pulse 78   Temp 98.3 F (36.8 C)   Resp (!) 21   Ht 5\' 1"  (1.549 m)   Wt 86.6 kg   SpO2 100%   BMI 36.09 kg/m   Physical Exam Vitals and nursing note reviewed.  Constitutional:      General: She is not in acute distress.    Appearance: Normal appearance.  HENT:  Head: Normocephalic and atraumatic.     Right Ear: External ear normal.     Left Ear: Tympanic membrane and external ear normal.     Ears:     Comments: Impacted cerumen to the right    Nose: Nose normal.     Mouth/Throat:     Mouth: Mucous membranes are moist.  Eyes:     General: No visual field deficit or scleral icterus.       Right eye: No discharge.        Left eye: No discharge.     Extraocular Movements: Extraocular movements intact.     Pupils: Pupils are equal, round, and reactive to light.     Comments: Fatigable horizontal nystagmus  Cardiovascular:     Rate and Rhythm: Normal rate and regular rhythm.     Pulses: Normal pulses.     Heart  sounds: Normal heart sounds.  Pulmonary:     Effort: Pulmonary effort is normal. No respiratory distress.     Breath sounds: Normal breath sounds.  Abdominal:     General: Abdomen is flat.     Tenderness: There is no abdominal tenderness.  Musculoskeletal:        General: Normal range of motion.     Cervical back: Normal range of motion.     Right lower leg: No edema.     Left lower leg: No edema.  Skin:    General: Skin is warm and dry.     Capillary Refill: Capillary refill takes less than 2 seconds.  Neurological:     Mental Status: She is alert and oriented to person, place, and time.     GCS: GCS eye subscore is 4. GCS verbal subscore is 5. GCS motor subscore is 6.     Cranial Nerves: Cranial nerves are intact. No dysarthria or facial asymmetry.     Sensory: Sensation is intact.     Motor: Motor function is intact. No tremor or abnormal muscle tone.     Coordination: Coordination is intact. Finger-Nose-Finger Test and Heel to [place] Test normal.     Gait: Gait is intact.  Psychiatric:        Mood and Affect: Mood normal.        Behavior: Behavior normal.    ED Results / Procedures / Treatments   Labs (all labs ordered are listed, but only abnormal results are displayed) Labs Reviewed  CBC - Abnormal; Notable for the following components:      Result Value   Platelets 418 (*)    All other components within normal limits  COMPREHENSIVE METABOLIC PANEL - Abnormal; Notable for the following components:   Glucose, Bld 102 (*)    Calcium 8.6 (*)    All other components within normal limits  RESP PANEL BY RT-PCR (FLU A&B, COVID) ARPGX2  I-STAT BETA HCG BLOOD, ED ([facility], [facility], [facility] ONLY)    EKG None  Radiology CT HEAD WO CONTRAST ( )  Result Date: 11/02/2020 CLINICAL DATA:  Headache and dizziness EXAM: CT HEAD WITHOUT CONTRAST TECHNIQUE: Contiguous axial images were obtained from the base of the skull through the vertex without intravenous contrast. COMPARISON:  None.  FINDINGS: Brain: No evidence of acute infarction, hemorrhage, hydrocephalus, extra-axial collection or mass lesion/mass effect. Vascular: No hyperdense vessel or unexpected calcification. Skull: Normal. Negative for fracture or focal lesion. Sinuses/Orbits: No acute finding. Other: None. IMPRESSION: No acute intracranial abnormality. Electronically Signed   By: Allegra Lai M.D.   On: 11/02/2020 09:42  Procedures .Ear Cerumen Removal  Date/Time: 11/02/2020 7:56 AM Performed by: Sloan Leiter, DO Authorized by: Sloan Leiter, DO   Consent:    Consent obtained:  Verbal   Consent given by:  Patient   Risks, benefits, and alternatives were discussed: yes     Risks discussed:  Bleeding, infection and pain   Alternatives discussed:  No treatment and delayed treatment Universal protocol:    Procedure explained and questions answered to patient or proxy's satisfaction: yes     Immediately prior to procedure, a time out was called: yes     Patient identity confirmed:  Verbally with patient Procedure details:    Location:  R ear   Procedure type: irrigation     Procedure outcomes: cerumen removed   Post-procedure details:    Inspection:  Ear canal clear and TM intact   Hearing quality:  Normal   Procedure completion:  Tolerated well, no immediate complications   Medications Ordered in ED Medications  ondansetron (ZOFRAN) injection 4 mg (4 mg Intravenous Given 11/02/20 0819)  sodium chloride 0.9 % bolus 1,000 mL (0 mLs Intravenous Stopped 11/02/20 1032)  meclizine (ANTIVERT) tablet 25 mg (25 mg Oral Given 11/02/20 0936)  sodium chloride 0.9 % bolus 1,000 mL (0 mLs Intravenous Stopped 11/02/20 1207)  meclizine (ANTIVERT) tablet 25 mg (25 mg Oral Given 11/02/20 1037)    ED Course  I have reviewed the triage vital signs and the nursing notes.  Pertinent labs & imaging results that were available during my care of the patient were reviewed by me and considered in my medical decision making  (see chart for details).    MDM Rules/Calculators/A&P                          This patient complains of dizzieness; this involves an extensive number of treatment Options and is a complaint that carries with it a high risk of complications and Morbidity.  Neurologic exam is nonfocal.  Impacted cerumen to the right ear.  Will attempt removal.  Patient agreeable to procedure.  See procedure note.  HINTs exam is consistent with peripheral vertigo.  Serious etiologies considered.   I ordered, reviewed and interpreted labs are stable  I ordered medication meclizine  I ordered imaging studies which included CT head without IV contrast  and I independently    visualized and interpreted imaging which showed nonacute     Following removal of cerumen and Antivert patient reports her symptoms have essentially resolved.  She is ambulatory.  She feels she is back to her baseline.  Patient likely with peripheral vertigo.  Rec patient follow-up with ENT regarding further care.  Supportive care at home including Epley maneuver and forward somersault technique.  Discussed ear hygiene  The patient improved significantly and was discharged in stable condition. Detailed discussions were had with the patient regarding current findings, and need for close f/u with PCP or on call doctor. The patient has been instructed to return immediately if the symptoms worsen in any way for re-evaluation. Patient verbalized understanding and is in agreement with current care plan. All questions answered prior to discharge.   Final Clinical Impression(s) / ED Diagnoses Final diagnoses:  Peripheral vertigo involving right ear  Impacted cerumen of right ear    Rx / DC Orders ED Discharge Orders          Ordered    meclizine (ANTIVERT) 25 MG tablet  3 times daily PRN  11/02/20 1159             Tanda RockersGray, Erionna Strum A, DO 11/02/20 1723

## 2020-11-02 NOTE — ED Notes (Signed)
Pt has refused covid test because she had a covid test done at the [place] 2 days ago.

## 2020-11-02 NOTE — ED Notes (Signed)
Dizziness, fogginess, nauseas, blurry vision and jittery started this morning. Dr at bedside.

## 2020-11-02 NOTE — ED Triage Notes (Signed)
Pt ambulatory to er room number 6, pt states that for the past two hours she has had some dizziness, states that it is worse when she moves her head.  Resps even and unlabored, denies weakness, reports some confusion, states that she feels a little foggy.

## 2022-03-09 ENCOUNTER — Ambulatory Visit
Admission: EM | Admit: 2022-03-09 | Discharge: 2022-03-09 | Disposition: A | Discharge status: Home / Self Care | Payer: No Typology Code available for payment source | Attending: Emergency Medicine | Admitting: Emergency Medicine

## 2022-03-09 DIAGNOSIS — U071 COVID-19: Secondary | ICD-10-CM | POA: Diagnosis present

## 2022-03-09 NOTE — ED Provider Notes (Signed)
[facility]    CSN: [id number] Arrival date & time: 03/09/22  1808      History   Chief Complaint Chief Complaint  Patient presents with   Covid Positive    HPI [name] is a 41 y.o. female.   Patient presents with fever, chills, nasal congestion, left-sided ear pain, rhinorrhea and jaw pain present for 7 days.  Fever and chills have resolved.  Jaw pain feels as if it is radiating to the ear.  Has been managing symptoms with over-the-counter medications.  Tested positive for COVID with home test 4 days ago.  Known sick contacts.  Requesting PCR for work and note to return.  History reviewed. No pertinent past medical history.  There are no problems to display for this patient.   Past Surgical History:  Procedure Laterality Date   ABLATION      OB History   No obstetric history on file.      Home Medications    Prior to Admission medications   Medication Sig Start Date End Date Taking? Authorizing Provider  meclizine (ANTIVERT) 25 MG tablet Take 1 tablet (25 mg total) by mouth 3 (three) times daily as needed for dizziness. 11/02/20  Yes Wynona Dove A, DO  ibuprofen (ADVIL,MOTRIN) 800 MG tablet Take 800 mg by mouth every 8 (eight) hours as needed for pain.    [provider]  omeprazole (PRILOSEC) 20 MG capsule Take 20 mg by mouth daily.  04/05/19  [provider]    Family History Family History  Problem Relation Age of Onset   Healthy Mother    Healthy Father     Social History Social History   Tobacco Use   Smoking status: Never   Smokeless tobacco: Never  Vaping Use   Vaping Use: Never used  Substance Use Topics   Alcohol use: No   Drug use: No     Allergies   Penicillins   Review of Systems Review of Systems  Constitutional:  Positive for chills and fever. Negative for activity change, appetite change, diaphoresis, fatigue and unexpected weight change.  HENT:  Positive for congestion, ear pain and  rhinorrhea. Negative for dental problem, drooling, ear discharge, facial swelling, hearing loss, mouth sores, nosebleeds, postnasal drip, sinus pressure, sinus pain, sneezing, sore throat, tinnitus, trouble swallowing and voice change.   Respiratory: Negative.    Cardiovascular: Negative.   Gastrointestinal: Negative.   Skin: Negative.      Physical Exam Triage Vital Signs ED Triage Vitals  Enc Vitals Group     BP 03/09/22 1932 (!) 129/99     Pulse Rate 03/09/22 1932 72     Resp 03/09/22 1932 18     Temp 03/09/22 1932 98.7 F (37.1 C)     Temp Source 03/09/22 1932 Oral     SpO2 03/09/22 1932 99 %     Weight 03/09/22 1931 190 lb (86.2 kg)     Height 03/09/22 1931 _0  (1.549 m)     Head Circumference --      Peak Flow --      Pain Score 03/09/22 1931 7     Pain Loc --      Pain Edu? --      Excl. in [place]? --    No data found.  Updated Vital Signs BP (!) 129/99 (BP Location: Left Arm)   Pulse 72   Temp 98.7 F (37.1 C) (Oral)   Resp 18   Ht _1  (1.549  m)   Wt 190 lb (86.2 kg)   LMP 03/02/2022   SpO2 99%   BMI 35.90 kg/m   Visual Acuity Right Eye Distance:   Left Eye Distance:   Bilateral Distance:    Right Eye Near:   Left Eye Near:    Bilateral Near:     Physical Exam Constitutional:      Appearance: Normal appearance.  HENT:     Right Ear: Tympanic membrane, ear canal and external ear normal.     Left Ear: Tympanic membrane, ear canal and external ear normal.     Nose: Nose normal.     Mouth/Throat:     Mouth: Mucous membranes are moist.     Pharynx: Oropharynx is clear.  Eyes:     Extraocular Movements: Extraocular movements intact.  Cardiovascular:     Rate and Rhythm: Normal rate and regular rhythm.     Pulses: Normal pulses.  Pulmonary:     Effort: Pulmonary effort is normal.     Breath sounds: Normal breath sounds.  Neurological:     Mental Status: She is alert.      UC Treatments / Results  Labs (all labs ordered are listed, but  only abnormal results are displayed) Labs Reviewed - No data to display  EKG   Radiology No results found.  Procedures Procedures (including critical care time)  Medications Ordered in UC Medications - No data to display  Initial Impression / Assessment and Plan / UC Course  I have reviewed the triage vital signs and the nursing notes.  Pertinent labs & imaging results that were available during my care of the patient were reviewed by me and considered in my medical decision making (see chart for details).  COVID-19  PCR pending, work note given to return if she has met quarantine guidelines per [employer], may continue use of over-the-counter medications as needed for symptom management, may follow-up as needed if symptoms persist or worsen Final Clinical Impressions(s) / UC Diagnoses   Final diagnoses:  COVID-19     Discharge Instructions      COVID-19 is a virus and will improve with time,  as your symptoms began 5 days you have met morning team guidelines per CDC and may continue activity wearing mask for an additional 5 days  You may take Tessalon pill every 8 hours to help reduce your coughing  May use Promethazine DM every 6 hours as needed for additional comfort, be mindful this will make you feel drowsy    You can take Tylenol and/or Ibuprofen as needed for fever reduction and pain relief.   For cough: honey 1/2 to 1 teaspoon (you can dilute the honey in water or another fluid).  You can also use guaifenesin and dextromethorphan for cough. You can use a humidifier for chest congestion and cough.  If you don't have a humidifier, you can sit in the bathroom with the hot shower running.      For sore throat: try warm salt water gargles, cepacol lozenges, throat spray, warm tea or water with lemon/honey, popsicles or ice, or OTC cold relief medicine for throat discomfort.   For congestion: take a daily anti-histamine like Zyrtec, Claritin, and a oral decongestant, such as  pseudoephedrine.  You can also use Flonase 1-2 sprays in each nostril daily.   It is important to stay hydrated: drink plenty of fluids (water, gatorade/powerade/pedialyte, juices, or teas) to keep your throat moisturized and help further relieve irritation/discomfort.    ED Prescriptions  None    PDMP not reviewed this encounter.   Hans Eden, NP 03/09/22 1954

## 2022-03-09 NOTE — Discharge Instructions (Addendum)
COVID-19 is a virus and will improve with time,  as your symptoms began 5 days you have met morning team guidelines per CDC and may continue activity wearing mask for an additional 5 days  You may take Tessalon pill every 8 hours to help reduce your coughing  May use Promethazine DM every 6 hours as needed for additional comfort, be mindful this will make you feel drowsy    You can take Tylenol and/or Ibuprofen as needed for fever reduction and pain relief.   For cough: honey 1/2 to 1 teaspoon (you can dilute the honey in water or another fluid).  You can also use guaifenesin and dextromethorphan for cough. You can use a humidifier for chest congestion and cough.  If you don't have a humidifier, you can sit in the bathroom with the hot shower running.      For sore throat: try warm salt water gargles, cepacol lozenges, throat spray, warm tea or water with lemon/honey, popsicles or ice, or OTC cold relief medicine for throat discomfort.   For congestion: take a daily anti-histamine like Zyrtec, Claritin, and a oral decongestant, such as pseudoephedrine.  You can also use Flonase 1-2 sprays in each nostril daily.   It is important to stay hydrated: drink plenty of fluids (water, gatorade/powerade/pedialyte, juices, or teas) to keep your throat moisturized and help further relieve irritation/discomfort.

## 2022-03-09 NOTE — ED Triage Notes (Signed)
Pt took a home Covid test and it was positive.   Pt states that she needs a doctors note stating that she has covid for her job.  Pt is having nasal congestion and left side ear and jaw pain x1week

## 2022-03-11 LAB — SARS CORONAVIRUS 2 (TAT 6-24 HRS): SARS Coronavirus 2: POSITIVE — AB

## 2022-08-18 IMAGING — CT CT HEAD W/O CM
3 series · 16 of 47 positions shown, 19 images · non-contrast
Comparison: None.

CLINICAL DATA: Headache and dizziness

EXAM:
CT HEAD WITHOUT CONTRAST
TECHNIQUE: Contiguous axial images were obtained from the base of the skull
through the vertex without intravenous contrast.

[Series 2: head w o · axial · 0.39mm/px · z∈[+10,+135]mm · 10 of 31 slices shown, 13 images]
[im 3/31  brain]
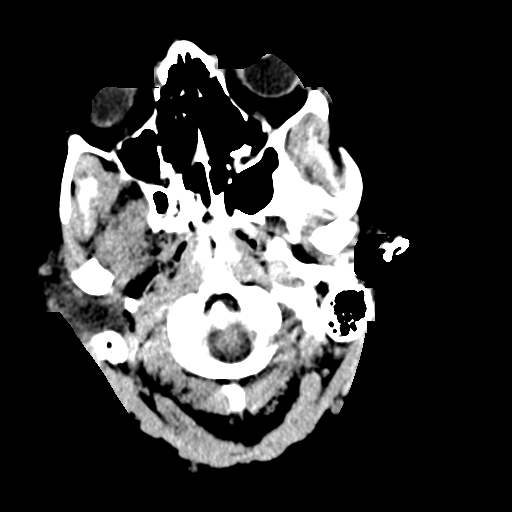
[im 3/31  bone]
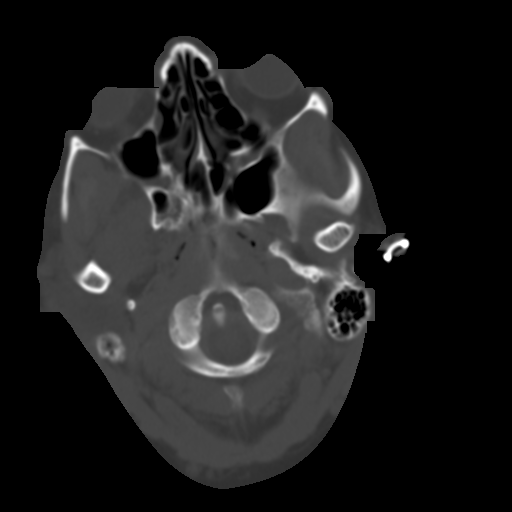
[im 6/31  brain]
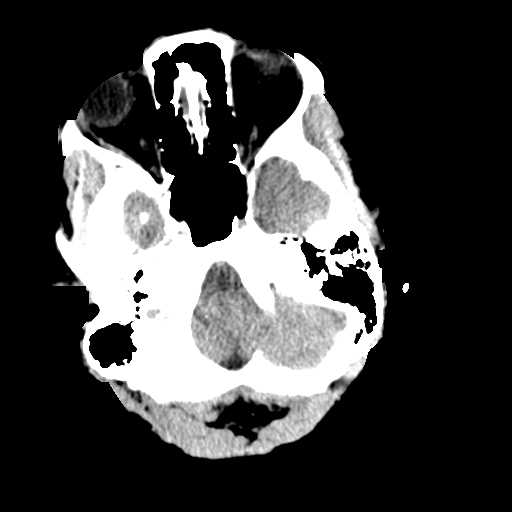
[im 9/31  brain]
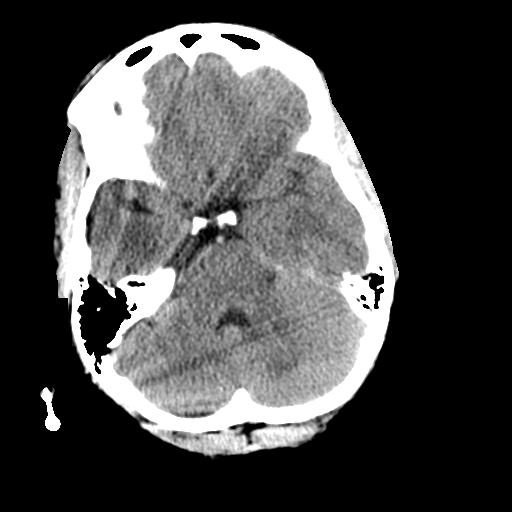
[im 11/31  brain]
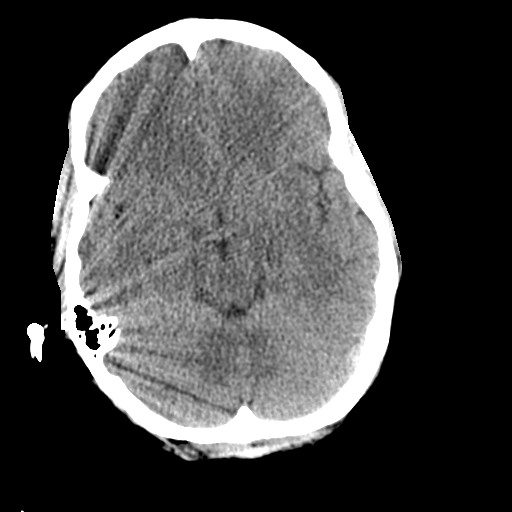
[im 14/31  brain]
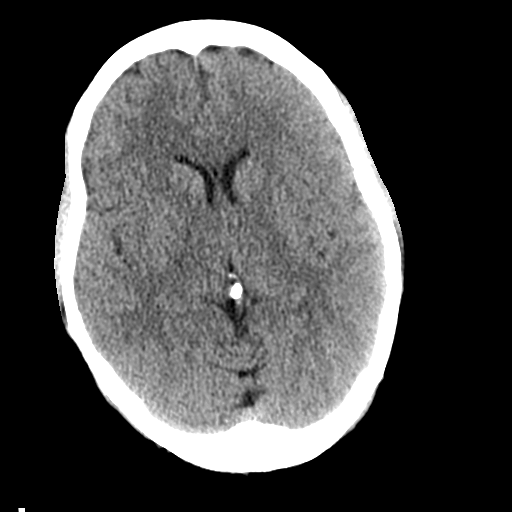
[im 14/31  bone]
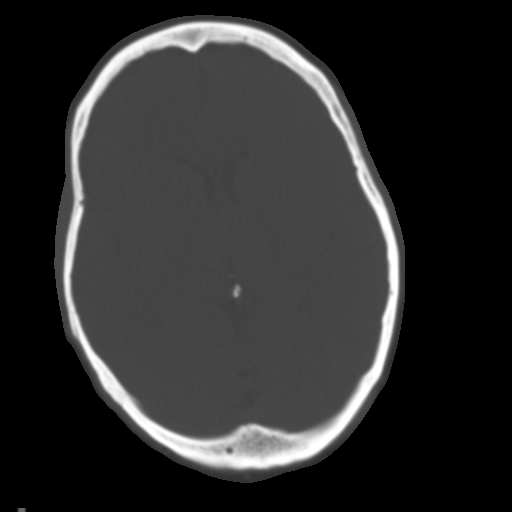
[im 17/31  brain]
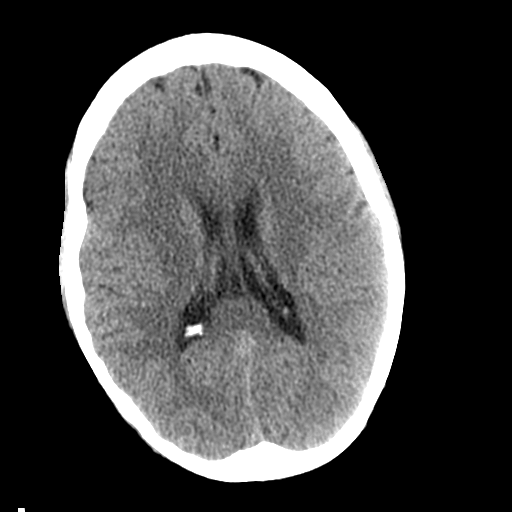
[im 20/31  brain]
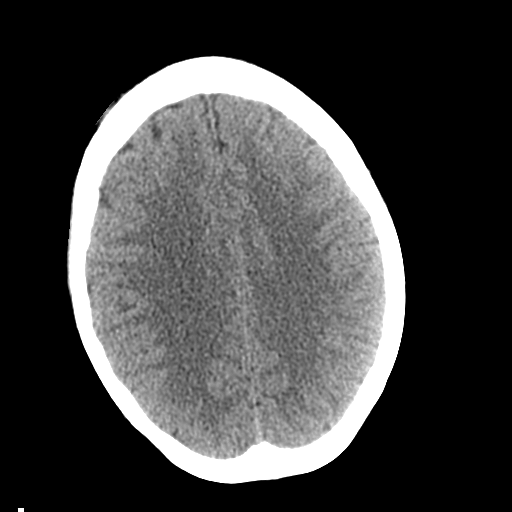
[im 23/31  brain]
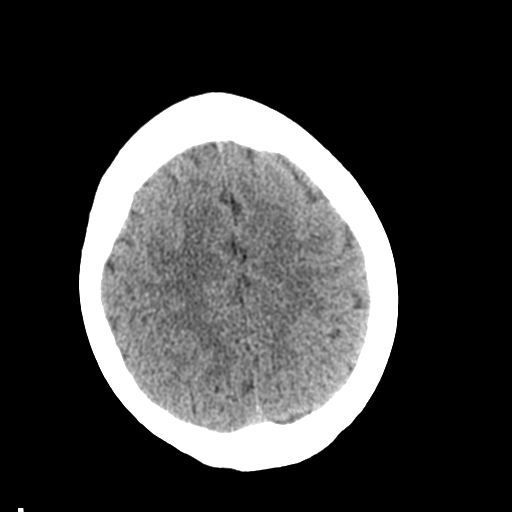
[im 25/31  brain]
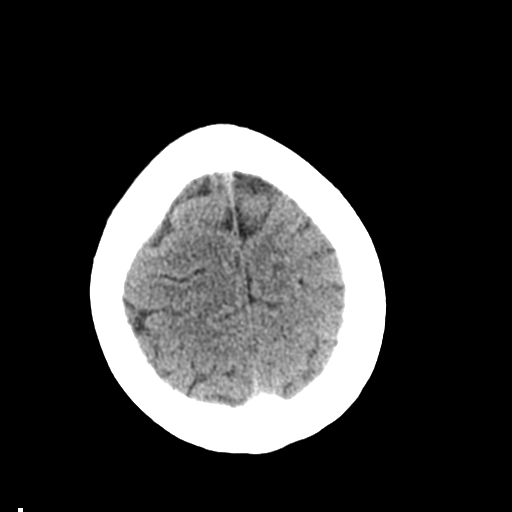
[im 25/31  bone]
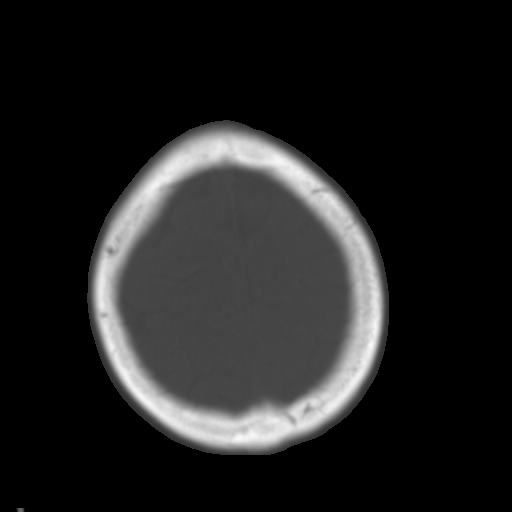
[im 28/31  brain]
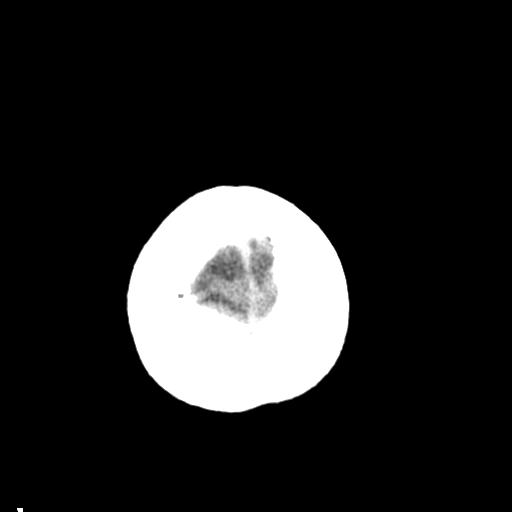

[Series 4: coronal soft · coronal · 0.32mm/px · 3 of 66 slices shown]
[im 22/66  brain]
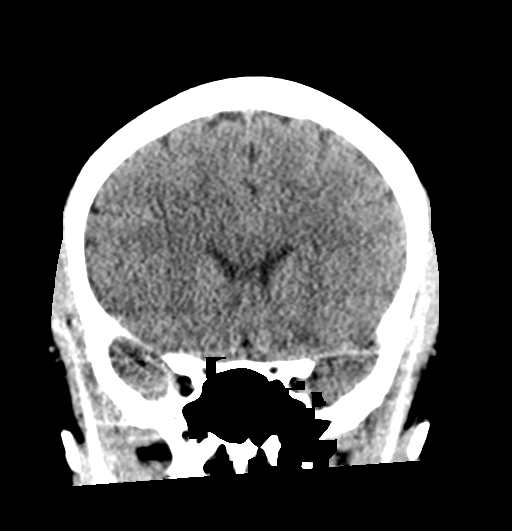
[im 29/66  brain]
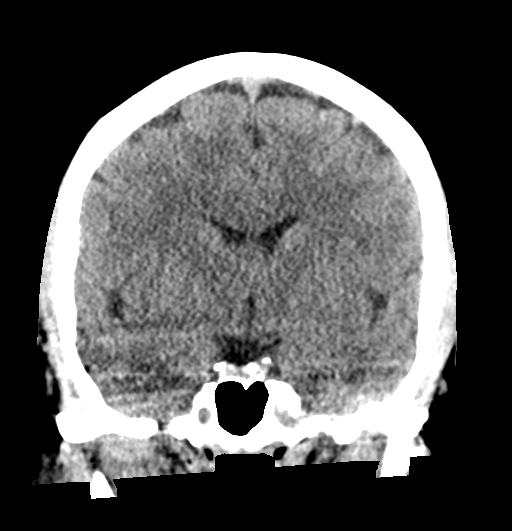
[im 37/66  brain]
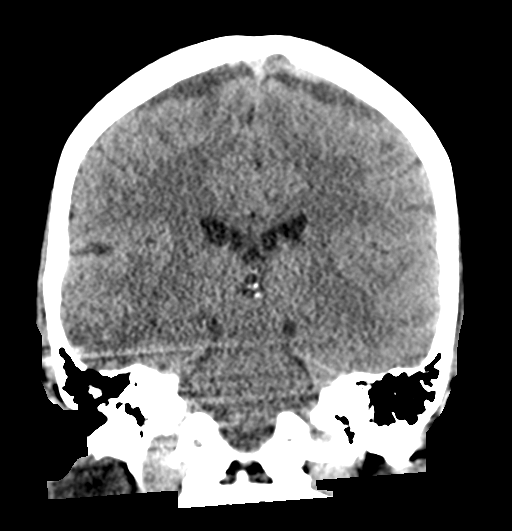

[Series 5: sagittal soft · sagittal · 0.31mm/px · 3 of 57 slices shown]
[im 19/57  brain]
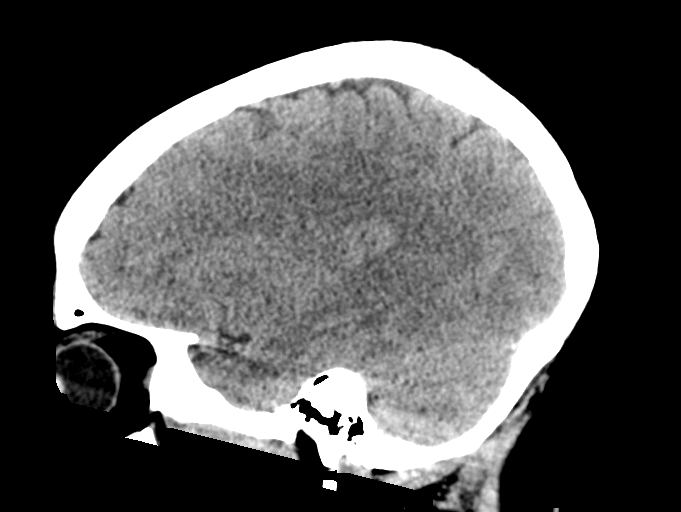
[im 29/57  brain]
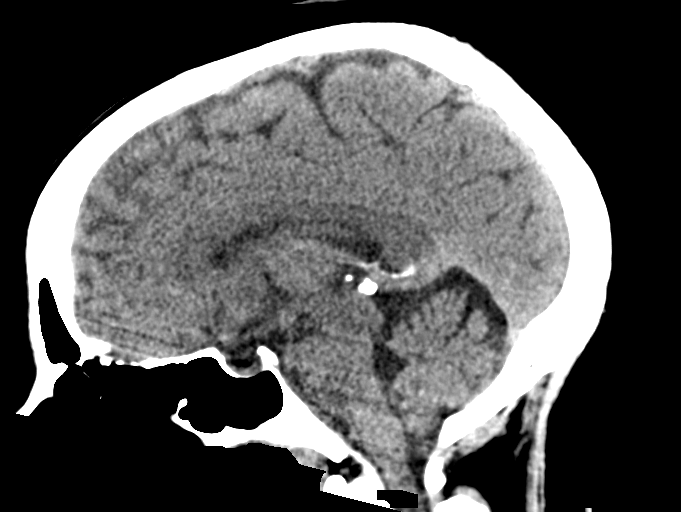
[im 38/57  brain]
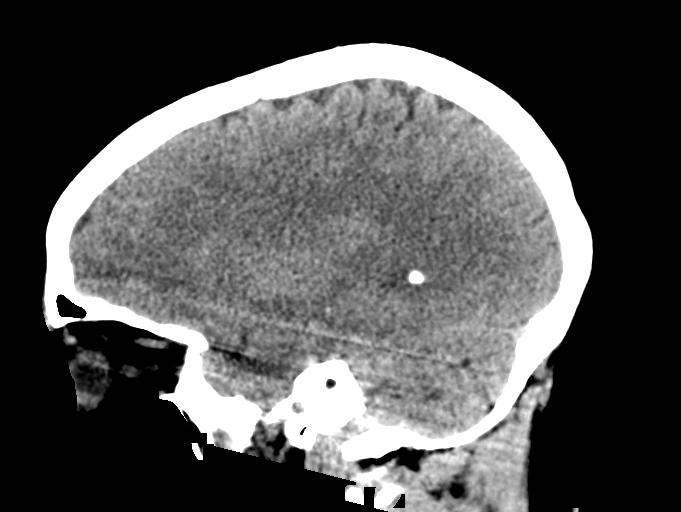

[16 of 47 positions shown; findings below may reference images not displayed]

FINDINGS: Brain: No evidence of acute infarction, hemorrhage, hydrocephalus,
extra-axial collection or mass lesion/mass effect.

Vascular: No hyperdense vessel or unexpected calcification.

Skull: Normal. Negative for fracture or focal lesion.

Sinuses/Orbits: No acute finding.

Other: None.
IMPRESSION: No acute intracranial abnormality.
